# Patient Record
Sex: Female | Born: 1995 | Race: Black or African American | Hispanic: No | Smoking: Never smoker
Health system: Southern US, Community
[De-identification: ages and names within clinical notes are randomized; demographics above are authoritative.]

## PROBLEM LIST (undated history)

## (undated) DIAGNOSIS — O24419 Gestational diabetes mellitus in pregnancy, unspecified control: Secondary | ICD-10-CM

## (undated) DIAGNOSIS — J45909 Unspecified asthma, uncomplicated: Secondary | ICD-10-CM

## (undated) DIAGNOSIS — G43909 Migraine, unspecified, not intractable, without status migrainosus: Secondary | ICD-10-CM

## (undated) HISTORY — PX: TONSILLECTOMY AND ADENOIDECTOMY: SUR1326

---

## 2016-06-08 ENCOUNTER — Other Ambulatory Visit: Payer: Self-pay

## 2016-06-13 ENCOUNTER — Encounter (HOSPITAL_COMMUNITY): Payer: Self-pay

## 2016-06-21 ENCOUNTER — Other Ambulatory Visit (HOSPITAL_COMMUNITY): Payer: Self-pay | Admitting: Nurse Practitioner

## 2016-06-21 DIAGNOSIS — O280 Abnormal hematological finding on antenatal screening of mother: Secondary | ICD-10-CM

## 2016-06-21 DIAGNOSIS — Z3A22 22 weeks gestation of pregnancy: Secondary | ICD-10-CM

## 2016-06-21 DIAGNOSIS — Z3689 Encounter for other specified antenatal screening: Secondary | ICD-10-CM

## 2016-07-01 ENCOUNTER — Encounter (HOSPITAL_COMMUNITY): Payer: Self-pay | Admitting: *Deleted

## 2016-07-05 ENCOUNTER — Other Ambulatory Visit (HOSPITAL_COMMUNITY): Payer: Self-pay | Admitting: Nurse Practitioner

## 2016-07-05 ENCOUNTER — Ambulatory Visit (HOSPITAL_COMMUNITY)
Admission: RE | Admit: 2016-07-05 | Discharge: 2016-07-05 | Disposition: A | Discharge status: Home / Self Care | Payer: Medicaid - Out of State | Source: Ambulatory Visit | Attending: Nurse Practitioner | Admitting: Nurse Practitioner

## 2016-07-05 ENCOUNTER — Other Ambulatory Visit (HOSPITAL_COMMUNITY): Payer: Self-pay | Admitting: *Deleted

## 2016-07-05 ENCOUNTER — Encounter (HOSPITAL_COMMUNITY): Payer: Self-pay

## 2016-07-05 DIAGNOSIS — Z3689 Encounter for other specified antenatal screening: Secondary | ICD-10-CM

## 2016-07-05 DIAGNOSIS — IMO0002 Reserved for concepts with insufficient information to code with codable children: Secondary | ICD-10-CM

## 2016-07-05 DIAGNOSIS — O280 Abnormal hematological finding on antenatal screening of mother: Secondary | ICD-10-CM | POA: Insufficient documentation

## 2016-07-05 DIAGNOSIS — E669 Obesity, unspecified: Secondary | ICD-10-CM | POA: Insufficient documentation

## 2016-07-05 DIAGNOSIS — Z0489 Encounter for examination and observation for other specified reasons: Secondary | ICD-10-CM

## 2016-07-05 DIAGNOSIS — Z3A22 22 weeks gestation of pregnancy: Secondary | ICD-10-CM

## 2016-07-05 DIAGNOSIS — O99212 Obesity complicating pregnancy, second trimester: Secondary | ICD-10-CM | POA: Insufficient documentation

## 2016-07-05 DIAGNOSIS — O289 Unspecified abnormal findings on antenatal screening of mother: Secondary | ICD-10-CM | POA: Insufficient documentation

## 2016-07-05 HISTORY — DX: Unspecified asthma, uncomplicated: J45.909

## 2016-07-05 HISTORY — DX: Migraine, unspecified, not intractable, without status migrainosus: G43.909

## 2016-07-05 NOTE — Progress Notes (Signed)
Genetic Counseling  High-Risk Gestation Note  Appointment Date:  07/05/2016 Referred By: Orbie Hurst, NP Date of Birth:  [birth year]  Pregnancy History: G1P0 Estimated Date of Delivery: 11/05/16 Estimated Gestational Age: [redacted]w[redacted]d Attending: Eulis Foster, MD   Ms. Sandra Dodson was seen for genetic counseling because of an increased risk for fetal Down syndrome based on maternal serum Quad screening results. Sandra Dodson mother and godmother also attended the genetic counseling appointment today.  In summary:  Reviewed results of screening test  Increased risk for Down syndrome  Discussed additional screening options  NIPS-performed today  Ultrasound-performed today; no anomalies or markers seen; views were suboptimal  Discussed diagnostic testing options  Amniocentesis-declined  Reviewed family history concerns  Discussed general population carrier screening options  CF, SMA, and hemoglobinopathies-declined  She was counseled regarding the screening result and the associated 1 in 189 risk for fetal Down syndrome.  We reviewed chromosomes, nondisjunction, and the common features and variable prognosis of Down syndrome. In addition, we reviewed the screen adjusted reduction in risks for trisomy 18 and open neural tube defects.  We also discussed other explanations for a screen positive result including: a gestational dating error, differences in maternal metabolism, and normal variation. In addition, we discussed that the MShCG/MSDIA MoM values were atypically elevated. Sandra Dodson was counseled that elevations in MShCG/MSDIA are associated with an increased risk for adverse outcomes including: PIH, preeclampsia, poor fetal growth, and preterm delivery. We discussed the recommendation for increased surveillance.   We reviewed other available screening options including noninvasive prenatal screening (NIPS)/cell free DNA (cfDNA) screening, and detailed ultrasound.  She was counseled  that screening tests are used to modify a patient's a priori risk for aneuploidy, typically based on age. This estimate provides a pregnancy specific risk assessment. We reviewed the benefits and limitations of each option. Specifically, we discussed the conditions for which each test screens, the detection rates, and false positive rates of each. She was also counseled regarding diagnostic testing via amniocentesis. We reviewed the approximate 1 in 500 risk for complications from amniocentesis, including spontaneous pregnancy loss. We discussed the possible results that the tests might provide including: positive, negative, unanticipated, and no result. Finally, she was counseled regarding the cost of each option and potential out of pocket expenses. After consideration of all the options, she elected to proceed with NIPS. Those results will be available in 5-7 days. The patient also expressed interest in having a detailed ultrasound.  A complete ultrasound was performed today. The ultrasound report will be documented separately. There were no visualized fetal anomalies or markers suggestive of aneuploidy; however, the views were suboptimal today.  Diagnostic testing was declined today.  She understands that screening tests cannot rule out all birth defects or genetic syndromes. The patient was advised of this limitation and states she still does not want additional testing at this time.   Sandra Dodson was provided with written information regarding cystic fibrosis (CF), spinal muscular atrophy (SMA) and hemoglobinopathies including the carrier frequency, availability of carrier screening and prenatal diagnosis if indicated.  In addition, we discussed that CF and hemoglobinopathies are routinely screened for as part of the [place] newborn screening panel.  After further discussion, she declined screening for CF, SMA and hemoglobinopathies.   Both family histories were reviewed and found to be noncontributory for birth  defects, intellectual disability, and known genetic conditions. Without further information regarding the provided family history, an accurate genetic risk cannot be calculated. Further genetic counseling is warranted  if more information is obtained.  Sandra Dodson denied exposure to environmental toxins or chemical agents. She denied the use of alcohol, tobacco or street drugs. She denied significant viral illnesses during the course of her pregnancy. Her medical and surgical histories were noncontributory.   I counseled Sandra Dodson for approximately 34 minutes regarding the above risks and available options.   Donald Prose, MS  Certified Genetic Counselor

## 2016-07-12 ENCOUNTER — Telehealth (HOSPITAL_COMMUNITY): Payer: Self-pay

## 2016-07-12 ENCOUNTER — Other Ambulatory Visit: Payer: Self-pay

## 2016-07-12 NOTE — Telephone Encounter (Signed)
Called Sandra Dodson to discuss her prenatal cell free DNA test results. Sandra Dodson had [facility] testing through [facility]. The patient was identified by name and DOB.  We reviewed that these are within normal limits, showing a less than 1 in 10,000 risk for trisomies 21, 18 and 13, and monosomy X (Turner syndrome).  In addition, the risk for triploidy and sex chromosome trisomies (47,XXX and 47,XXY) was also low risk.  We reviewed that this testing identifies > 99% of pregnancies with trisomy 93, trisomy 58, sex chromosome trisomies (47,XXX and 47,XXY), and triploidy. The detection rate for trisomy 18 is 96%.  The detection rate for monosomy X is ~92%.  The false positive rate is <0.1% for all conditions. Testing was also consistent with female fetal sex. The patient did wish to know fetal sex.  She understands that this testing does not identify all genetic conditions.  All questions were answered to her satisfaction, she was encouraged to call with additional questions or concerns.  Donald Prose, MS Certified Genetic Counselor

## 2016-08-02 ENCOUNTER — Ambulatory Visit (HOSPITAL_COMMUNITY)
Admission: RE | Admit: 2016-08-02 | Discharge: 2016-08-02 | Disposition: A | Discharge status: Home / Self Care | Payer: Medicaid - Out of State | Source: Ambulatory Visit | Attending: Nurse Practitioner | Admitting: Nurse Practitioner

## 2016-08-12 ENCOUNTER — Ambulatory Visit (HOSPITAL_COMMUNITY)
Admission: RE | Admit: 2016-08-12 | Discharge: 2016-08-12 | Disposition: A | Discharge status: Home / Self Care | Payer: Medicaid - Out of State | Source: Ambulatory Visit | Attending: Nurse Practitioner | Admitting: Nurse Practitioner

## 2016-08-12 DIAGNOSIS — Z0489 Encounter for examination and observation for other specified reasons: Secondary | ICD-10-CM

## 2016-08-12 DIAGNOSIS — IMO0002 Reserved for concepts with insufficient information to code with codable children: Secondary | ICD-10-CM

## 2016-08-30 ENCOUNTER — Ambulatory Visit (HOSPITAL_COMMUNITY): Payer: Medicaid - Out of State

## 2016-09-01 ENCOUNTER — Other Ambulatory Visit (HOSPITAL_COMMUNITY): Payer: Self-pay | Admitting: *Deleted

## 2016-09-01 ENCOUNTER — Ambulatory Visit (HOSPITAL_COMMUNITY)
Admission: RE | Admit: 2016-09-01 | Discharge: 2016-09-01 | Disposition: A | Discharge status: Home / Self Care | Payer: Medicaid - Out of State | Source: Ambulatory Visit | Attending: Nurse Practitioner | Admitting: Nurse Practitioner

## 2016-09-01 ENCOUNTER — Encounter (HOSPITAL_COMMUNITY): Payer: Self-pay

## 2016-09-01 DIAGNOSIS — O09899 Supervision of other high risk pregnancies, unspecified trimester: Secondary | ICD-10-CM

## 2016-09-01 DIAGNOSIS — Z36 Encounter for antenatal screening for chromosomal anomalies: Secondary | ICD-10-CM | POA: Insufficient documentation

## 2016-09-01 DIAGNOSIS — O288 Other abnormal findings on antenatal screening of mother: Principal | ICD-10-CM

## 2016-09-01 DIAGNOSIS — Z3A30 30 weeks gestation of pregnancy: Secondary | ICD-10-CM | POA: Insufficient documentation

## 2016-09-01 DIAGNOSIS — O99213 Obesity complicating pregnancy, third trimester: Secondary | ICD-10-CM | POA: Insufficient documentation

## 2016-09-01 DIAGNOSIS — O289 Unspecified abnormal findings on antenatal screening of mother: Secondary | ICD-10-CM | POA: Insufficient documentation

## 2016-09-29 ENCOUNTER — Ambulatory Visit (HOSPITAL_COMMUNITY)
Admission: RE | Admit: 2016-09-29 | Discharge: 2016-09-29 | Disposition: A | Discharge status: Home / Self Care | Payer: Medicaid - Out of State | Source: Ambulatory Visit | Attending: Nurse Practitioner | Admitting: Nurse Practitioner

## 2016-09-29 ENCOUNTER — Encounter (HOSPITAL_COMMUNITY): Payer: Self-pay

## 2016-09-29 ENCOUNTER — Other Ambulatory Visit (HOSPITAL_COMMUNITY): Payer: Self-pay | Admitting: *Deleted

## 2016-09-29 DIAGNOSIS — O24414 Gestational diabetes mellitus in pregnancy, insulin controlled: Secondary | ICD-10-CM | POA: Insufficient documentation

## 2016-09-29 DIAGNOSIS — Z3A34 34 weeks gestation of pregnancy: Secondary | ICD-10-CM | POA: Insufficient documentation

## 2016-09-29 DIAGNOSIS — O288 Other abnormal findings on antenatal screening of mother: Secondary | ICD-10-CM | POA: Diagnosis present

## 2016-09-29 DIAGNOSIS — O09899 Supervision of other high risk pregnancies, unspecified trimester: Secondary | ICD-10-CM | POA: Diagnosis present

## 2016-09-29 DIAGNOSIS — O99212 Obesity complicating pregnancy, second trimester: Secondary | ICD-10-CM | POA: Insufficient documentation

## 2016-10-21 ENCOUNTER — Other Ambulatory Visit (HOSPITAL_COMMUNITY): Payer: Self-pay | Admitting: Maternal and Fetal Medicine

## 2016-10-21 ENCOUNTER — Encounter (HOSPITAL_COMMUNITY): Payer: Self-pay

## 2016-10-21 ENCOUNTER — Ambulatory Visit (HOSPITAL_COMMUNITY)
Admission: RE | Admit: 2016-10-21 | Discharge: 2016-10-21 | Disposition: A | Discharge status: Home / Self Care | Payer: Medicaid - Out of State | Source: Ambulatory Visit | Attending: Nurse Practitioner | Admitting: Nurse Practitioner

## 2016-10-21 DIAGNOSIS — O24414 Gestational diabetes mellitus in pregnancy, insulin controlled: Secondary | ICD-10-CM

## 2016-10-21 DIAGNOSIS — Z362 Encounter for other antenatal screening follow-up: Secondary | ICD-10-CM

## 2016-10-21 DIAGNOSIS — O99213 Obesity complicating pregnancy, third trimester: Secondary | ICD-10-CM

## 2016-10-21 DIAGNOSIS — Z3A37 37 weeks gestation of pregnancy: Secondary | ICD-10-CM

## 2016-10-21 DIAGNOSIS — O281 Abnormal biochemical finding on antenatal screening of mother: Secondary | ICD-10-CM

## 2016-10-21 HISTORY — DX: Gestational diabetes mellitus in pregnancy, unspecified control: O24.419

## 2017-01-30 ENCOUNTER — Encounter (HOSPITAL_COMMUNITY): Payer: Self-pay

## 2018-02-17 IMAGING — US US MFM OB TRANSVAGINAL
2 series · 13 of 28 positions shown · non-contrast
Comparison: none

Centre
[address]

1  HARADOES YOOLOKENI            [id number]      [id number]     [id number]
2  HARADOES YOOLOKENI            [id number]      [id number]     [id number]
Indications
22 weeks gestation of pregnancy
Abnormal biochemical screen (quad) for
Trisomy 21 ([DATE]) (elevated inhibin
1.114o4)
Encounter for antenatal screening for
chromosomal anomalies
Obesity complicating pregnancy, second
trimester
OB History
Gravidity:    1         Term:   0        Prem:   0        SAB:   0
TOP:          0       Ectopic:  0        Living: 0
Fetal Evaluation
Num Of Fetuses:     1
Fetal Heart         142
Rate(bpm):
Cardiac Activity:   Observed
Presentation:       Cephalic
Placenta:           Anterior, above cervical os
P. Cord Insertion:  Visualized, central
Amniotic Fluid
AFI FV:      Subjectively within normal limits
Largest Pocket(cm)
3.8
Biometry
BPD:      54.2  mm     G. Age:  22w 3d         49  %    CI:        77.92   %   70 - 86
FL/HC:      18.0   %   18.4 -
HC:      194.3  mm     G. Age:  21w 4d         12  %    HC/AC:      1.12       1.06 -
AC:       174   mm     G. Age:  22w 2d         39  %    FL/BPD:     64.4   %   71 - 87
FL:       34.9  mm     G. Age:  21w 0d          7  %    FL/AC:      20.1   %   20 - 24
HUM:      32.3  mm     G. Age:  20w 6d          9  %
Est. FW:     449  gm          1 lb      31  %
Gestational Age
LMP:           23w 1d       Date:   01/25/16                 EDD:   10/31/16
U/S Today:     21w 6d                                        EDD:   11/09/16
Best:          22w 3d    Det. By:   Previous Ultrasound      EDD:   11/05/16
(06/13/16)
Anatomy
Cranium:               Appears normal         Aortic Arch:            Not well visualized
Cavum:                 Appears normal         Ductal Arch:            Not well visualized
Ventricles:            Appears normal         Diaphragm:              Not well visualized
Choroid Plexus:        Appears normal         Stomach:                Appears normal, left
sided
Cerebellum:            Appears normal         Abdomen:                Appears normal
Posterior Fossa:       Not well visualized    Abdominal Wall:         Appears nml (cord
insert, abd wall)
Nuchal Fold:           Not applicable (>20    Cord Vessels:           Appears normal (3
wks GA)                                        vessel cord)
Face:                  Orbits nl; profile not Kidneys:                Appear normal
well visualized
Lips:                  Not well visualized    Bladder:                Appears normal
Thoracic:              Appears normal         Spine:                  Not well visualized
Heart:                 Not well visualized    Upper Extremities:      Appears normal;
hands nws
RVOT:                  Not well visualized    Lower Extremities:      Appears normal;
ankles nws
LVOT:                  Not well visualized
Other:  Heels visualized. Technically difficult due to maternal habitus and
fetal position. Rt 5th digit visualized.
Cervix Uterus Adnexa
Cervix
Length:            3.3  cm.
Normal appearance by transvaginal scan
Uterus
No abnormality visualized.
Left Ovary
No adnexal mass visualized.
Right Ovary
Cul De Sac:   No free fluid seen.
Adnexa:       No abnormality visualized.
Impression
INDICATION: 21 yr old G1P0 at 88w8d with elevated risk of
trisomy 2[REDACTED] on quad screen and elevated inhibin of
1.114o4 for fetal anatomic survey.

[Series 1: us mfm ob transvaginal · 52 acquisitions, 10 frames shown (1 of 2)]
[im 3/52]
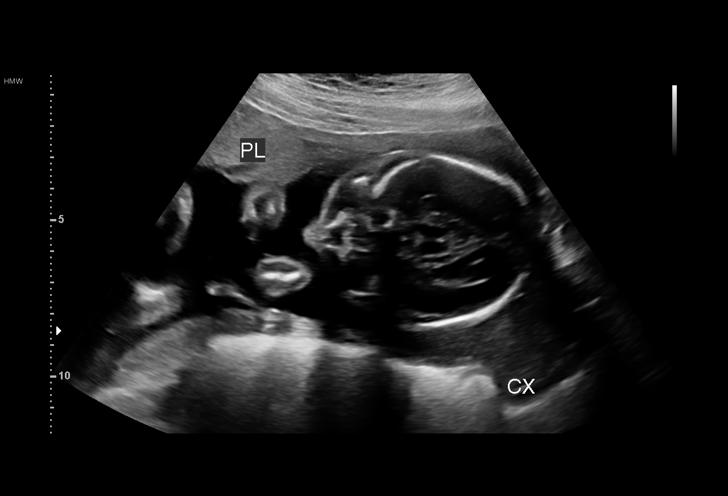
[im 8/52]
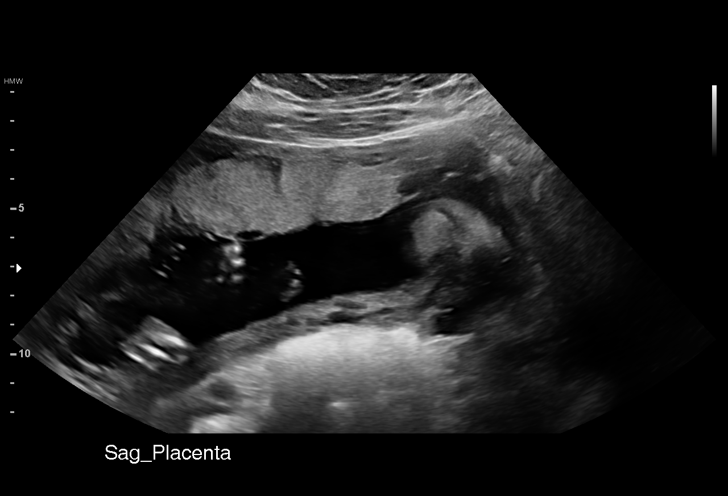
[im 13/52]
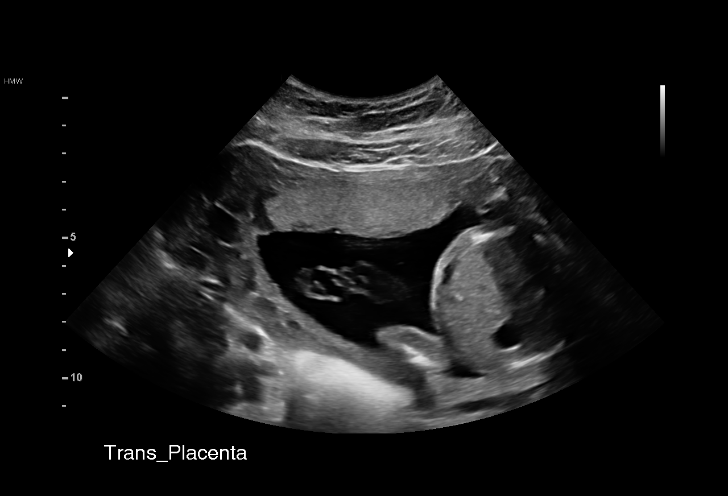
[im 18/52]
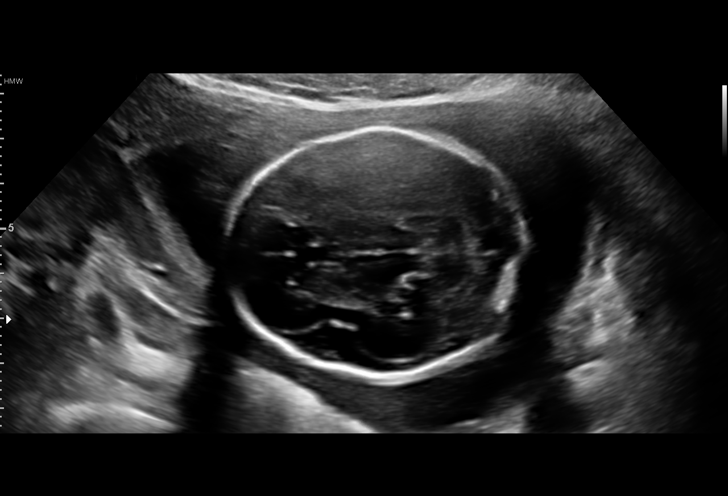
[im 22/52]
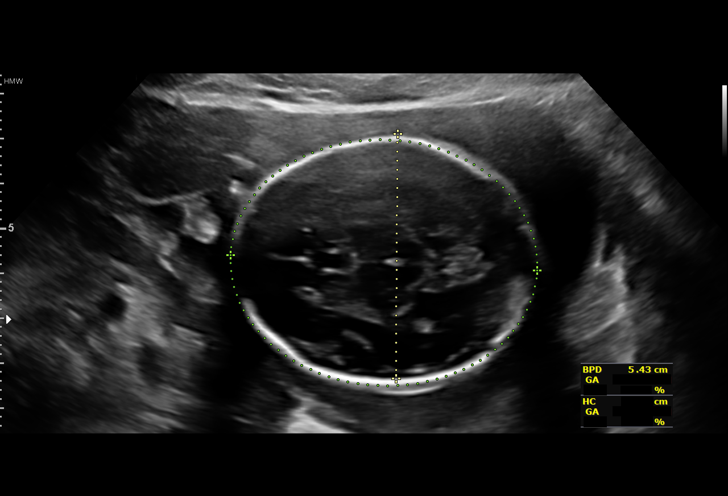
[im 27/52]
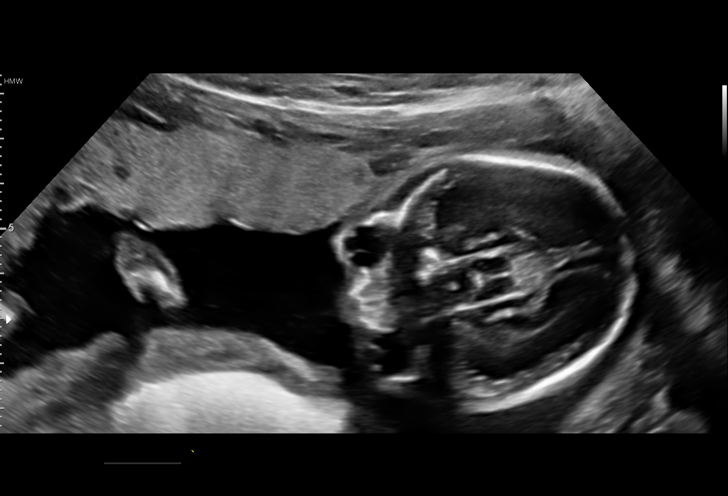
[im 35/52]
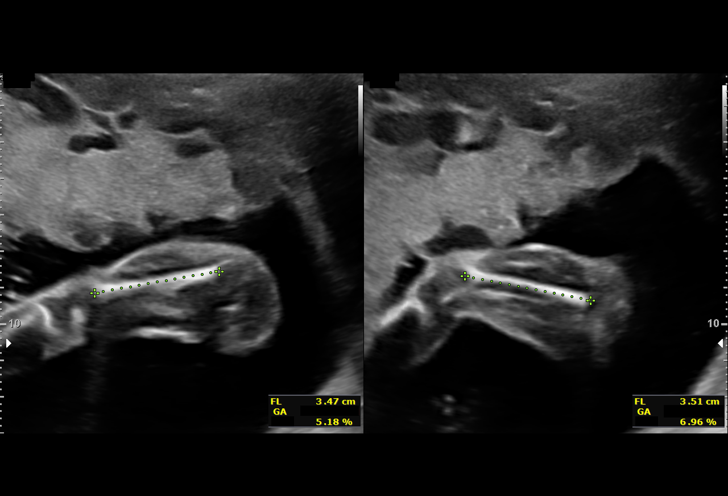
[im 39/52]
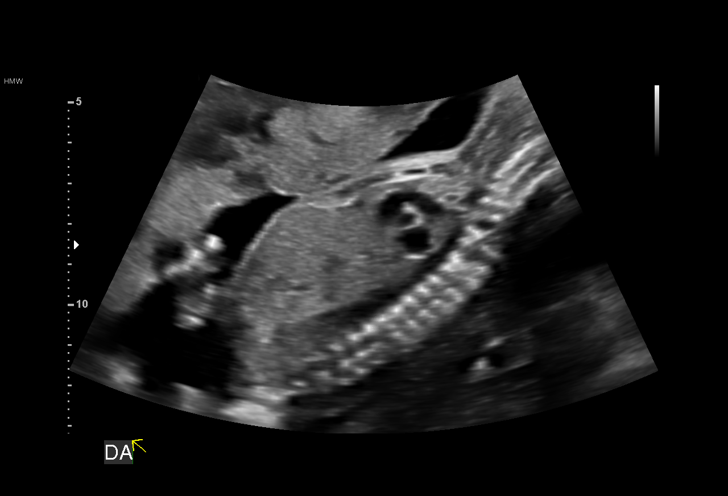
[im 44/52]
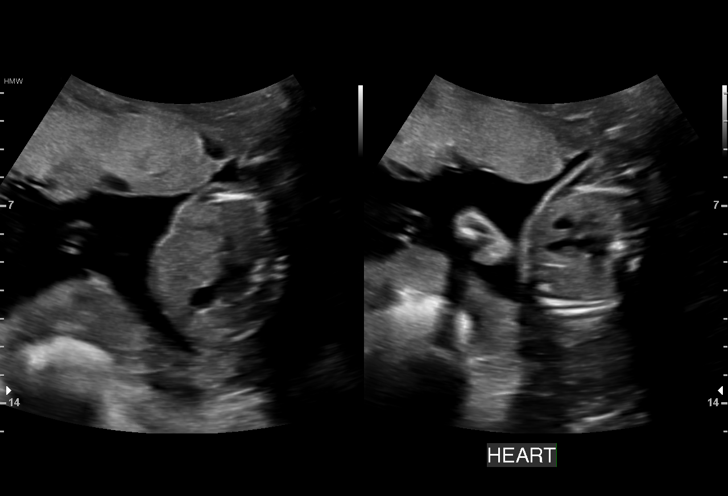
[im 49/52]
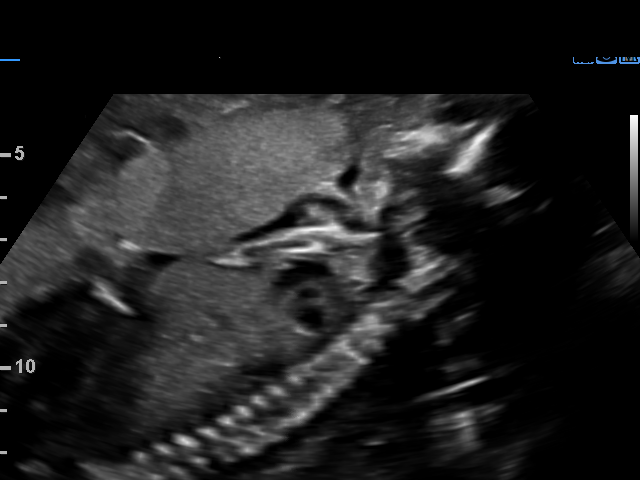

[Series 3: us mfm ob transvaginal · 3 of 14 slices shown (2 of 2)]
[im 1/14]
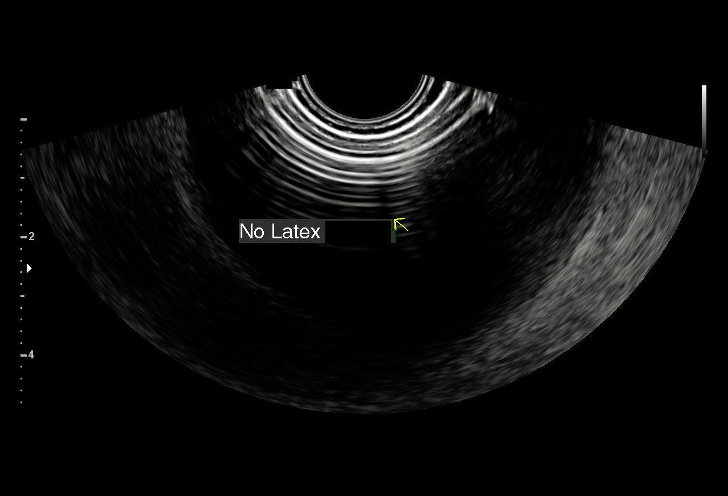
[im 6/14]
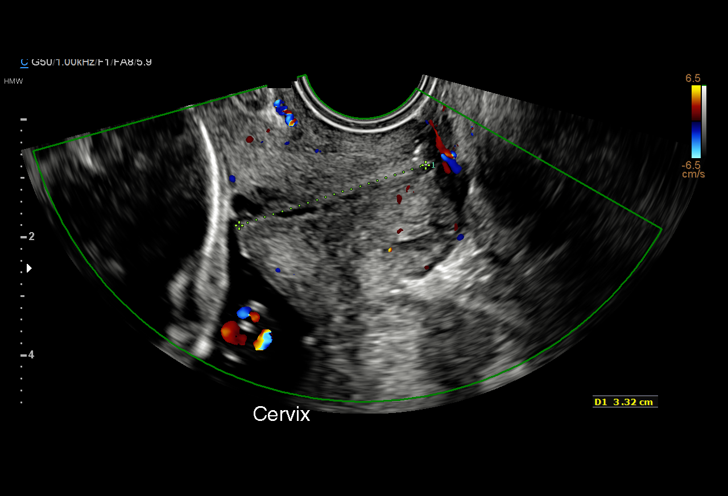
[im 11/14]
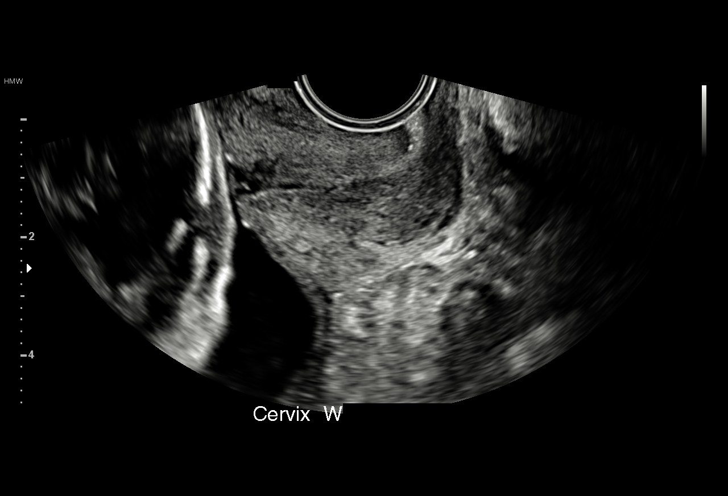

[13 of 28 positions shown; findings below may reference images not displayed]

FINDINGS: 1. Single intrauterine pregnancy.
2. Estimated fetal weight is in the 31st%.
3. Anterior placenta without evidence of previa.
4. Normal amniotic fluid volume.
5. Normal transvaginal cervical length.
6. The anatomy survey is limited as above; no abnormalities
seen on limited exam.
Recommendations

1. Appropriate fetal growth.
2. Limited anatomy survey:
- recommend follow up in 4 weeks to reevaluate fetal anatomy
3. Abnormal quad screen:
- patient met with the genetic counselor; see separate report
- after counseling patient opted for cell free fetal DNA which
was drawn today
4. Elevated inhibin:
- discussed association with increased risk of fetal growth
restriction, preeclampsia, preterm delivery, abruption, and
stillbirth
- recommend serial fetal growth
- recommend close surveillance for the development of
signs/symptoms of preeclampsia

## 2018-05-14 IMAGING — US US MFM OB FOLLOW-UP
1 series · 14 of 28 positions shown · non-contrast
Comparison: none

[Series 1: us mfm ob follow-up · 62 acquisitions, 14 frames shown]
[im 3/62]
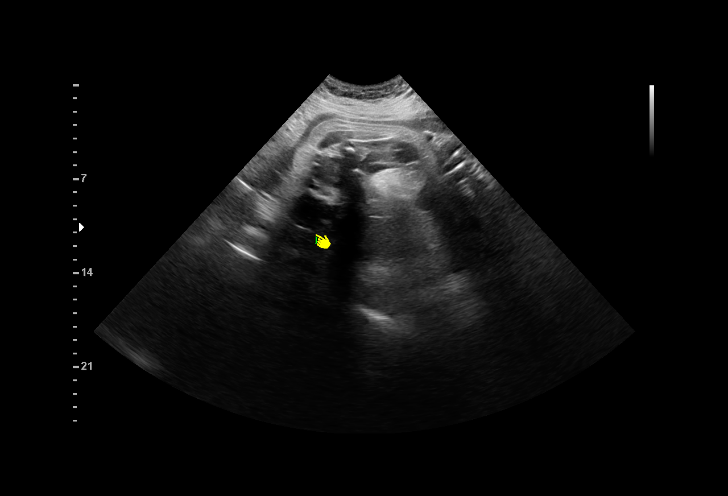
[im 7/62]
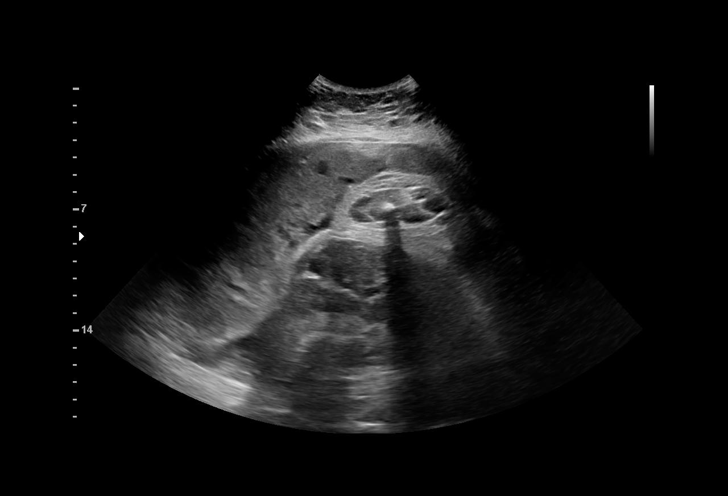
[im 12/62]
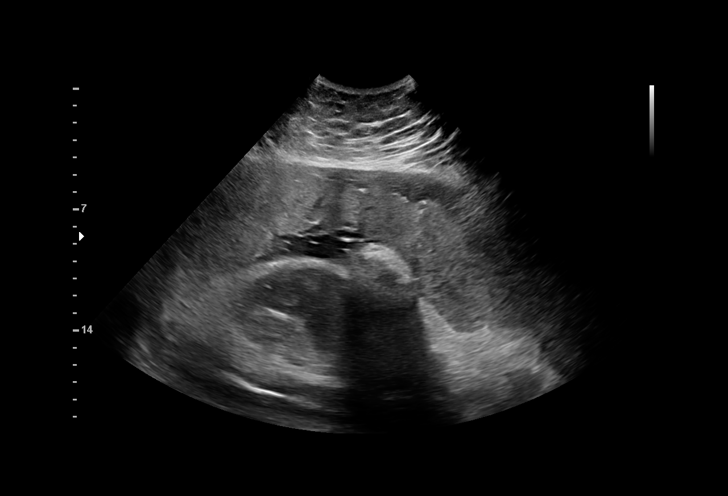
[im 16/62]
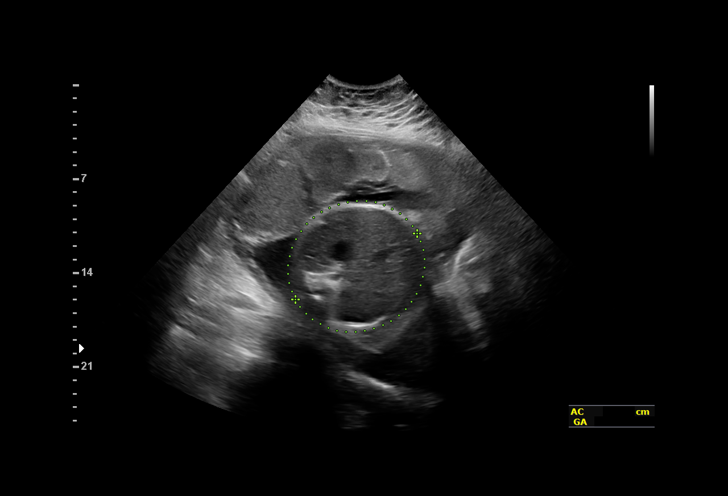
[im 21/62]
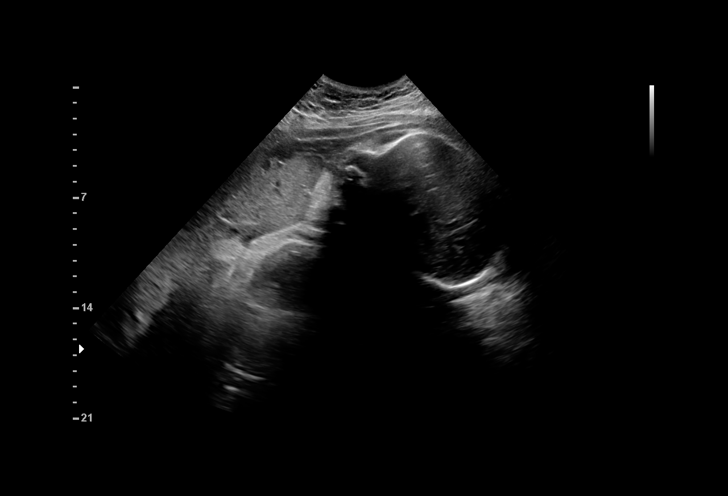
[im 25/62]
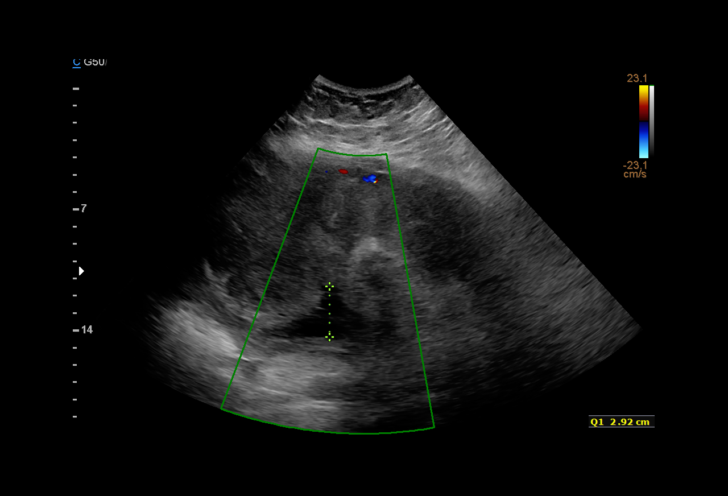
[im 30/62]
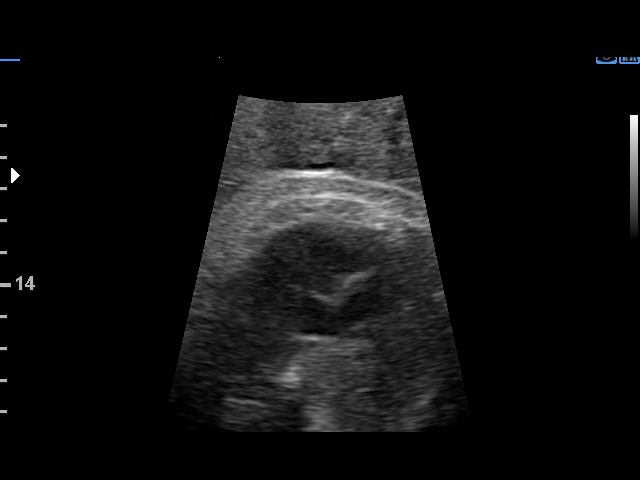
[im 34/62]
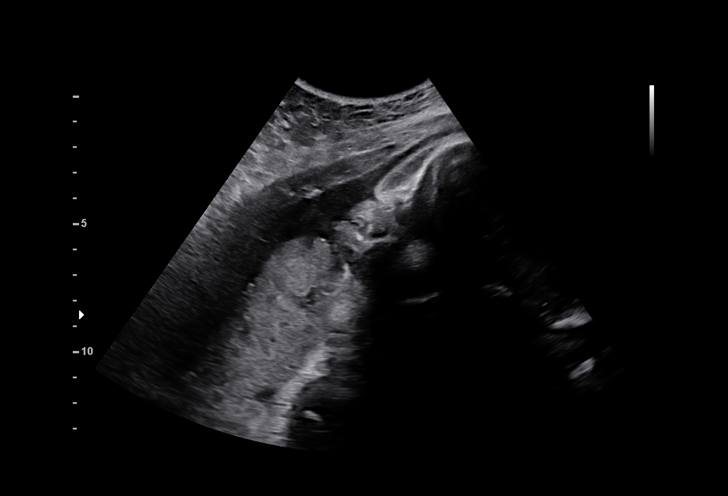
[im 39/62]
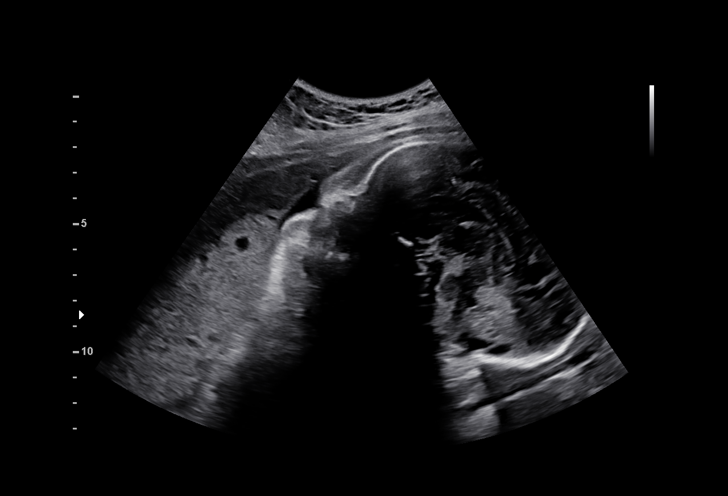
[im 43/62]
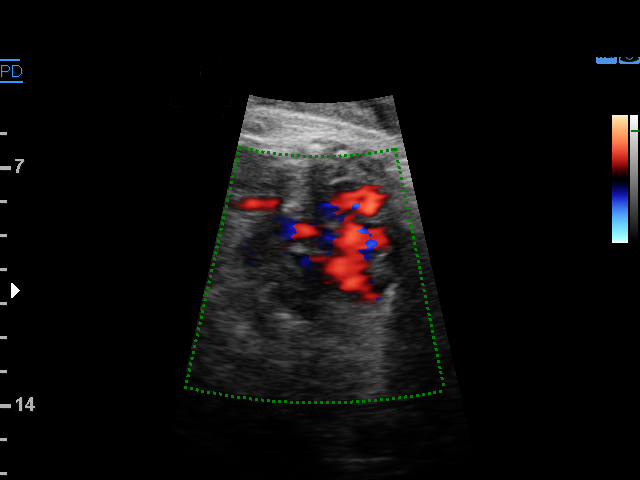
[im 48/62]
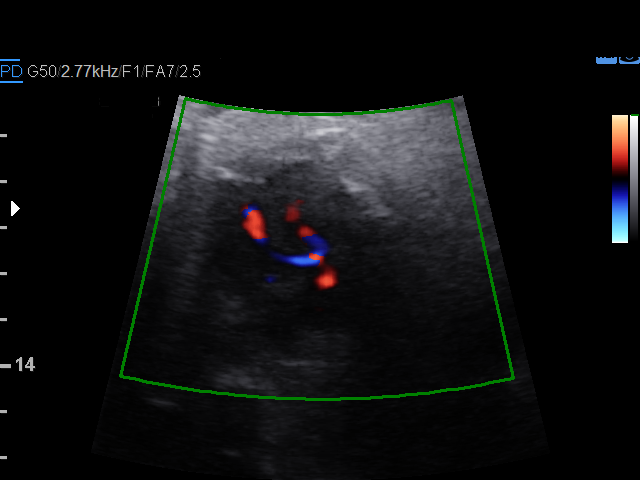
[im 52/62]
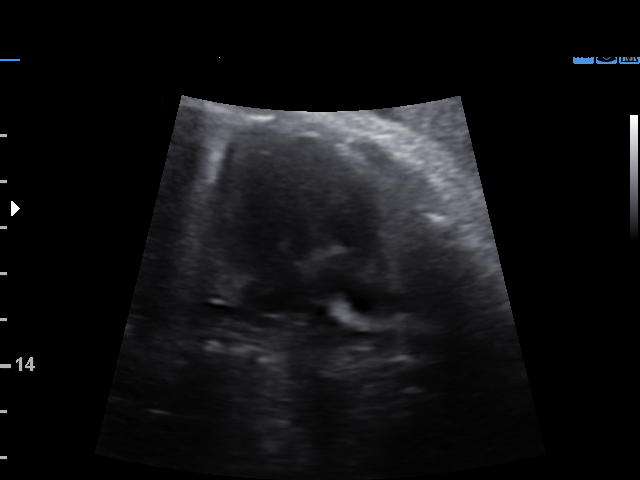
[im 57/62]
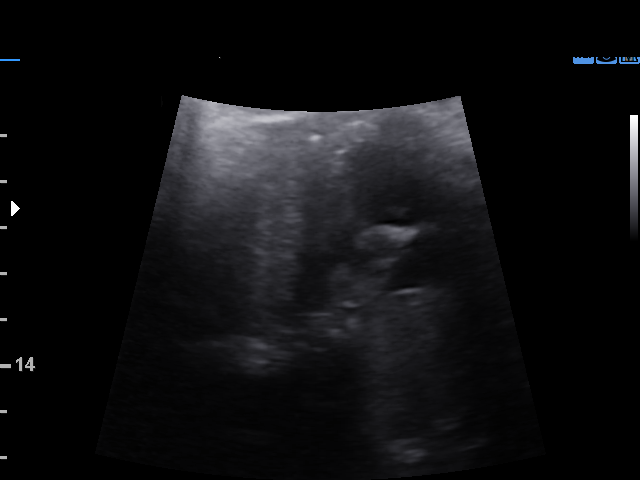
[im 62/62]
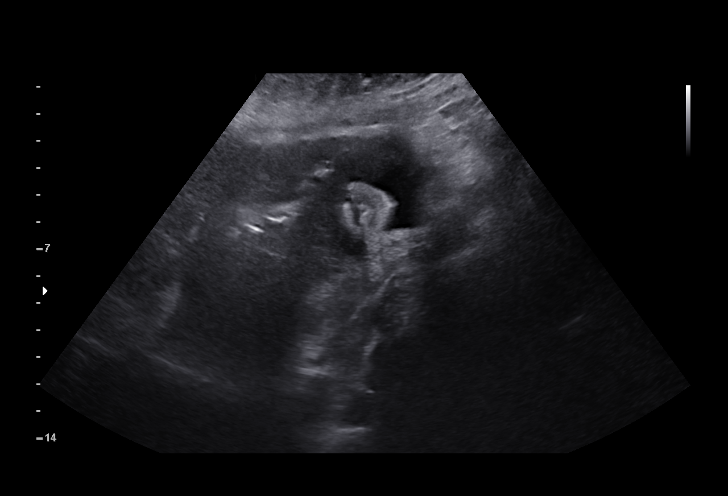

[14 of 28 positions shown; findings below may reference images not displayed]

Centre
[address]

1  DONGHUA MICHAELOV            [id number]      [id number]     [id number]
Indications

34 weeks gestation of pregnancy
Abnormal biochemical screen (quad) for
Trisomy 21 ([DATE]) (elevated inhibin
[id number]); low risk NIPS
Obesity complicating pregnancy, third
trimester
Gestational diabetes in pregnancy, insulin
controlled
OB History

Blood Type:            Height:         Weight (lb):  258      BMI:
Gravidity:    1         Term:   0        Prem:   0        SAB:   0
TOP:          0       Ectopic:  0        Living: 0
Fetal Evaluation

Num Of Fetuses:     1
Fetal Heart         141
Rate(bpm):
Cardiac Activity:   Observed
Presentation:       Cephalic
Placenta:           Anterior, above cervical os
P. Cord Insertion:  Previously Visualized

Amniotic Fluid
AFI FV:      Subjectively within normal limits

AFI Sum(cm)     %Tile       Largest Pocket(cm)
15.3            55

RUQ(cm)       RLQ(cm)       LUQ(cm)        LLQ(cm)
2.92
Biometry

BPD:      89.2  mm     G. Age:  36w 1d         86  %    CI:        79.91   %   70 - 86
FL/HC:      20.3   %   20.1 -
HC:      315.3  mm     G. Age:  35w 3d         32  %    HC/AC:      0.98       0.93 -
AC:      321.8  mm     G. Age:  36w 1d         88  %    FL/BPD:     71.6   %   71 - 87
FL:       63.9  mm     G. Age:  33w 0d          8  %    FL/AC:      19.9   %   20 - 24
HUM:      58.8  mm     G. Age:  34w 0d         51  %

Est. FW:    7673  gm    5 lb 13 oz      71  %
Gestational Age

LMP:           35w 3d       Date:   01/25/16                 EDD:   10/31/16
U/S Today:     35w 1d                                        EDD:   11/02/16
Best:          34w 5d    Det. By:   Previous Ultrasound      EDD:   11/05/16
(06/13/16)
Anatomy

Cranium:               Appears normal         Aortic Arch:            Previously seen
Cavum:                 Previously seen        Ductal Arch:            Previously seen
Ventricles:            Previously seen        Diaphragm:              Previously seen
Choroid Plexus:        Previously seen        Stomach:                Appears normal, left
sided
Cerebellum:            Previously seen        Abdomen:                Appears normal
Posterior Fossa:       Not well visualized    Abdominal Wall:         Previously seen
Nuchal Fold:           Not applicable (>20    Cord Vessels:           Previously seen
wks GA)
Face:                  Orbits prev vis;       Kidneys:                Previously seen
profile appears
normal
Lips:                  Appears normal         Bladder:                Appears normal
Thoracic:              Appears normal         Spine:                  Limited views prev
vis appear nl
Heart:                 Appears normal         Upper Extremities:      Previously seen;
(4CH, axis, and                                hands nws
situs)
RVOT:                  Appears normal         Lower Extremities:      Previously seen;
ankles nws
LVOT:                  Appears normal

Other:  Heels previously visualized. Technically difficult due to maternal
habitus and fetal position. Rt 5th digit  previously visualized.
Cervix Uterus Adnexa

Cervix
Not visualized (advanced GA >00wks)
Impression

SIUP at 34+5 weeks
Normal interval anatomy; anatomic survey complete except
for PF; heart was again very difficult to image but it appeared
grossly normal; significant amount of adipose tissue
visualized (especially face and abdomen)
Normal amniotic fluid volume
Appropriate interval growth with EFW at the 71st %tile; AC at
the 88th %tile
Recommendations

Begin twice weekly NSTs with weekly AFIs
Growth US in 3 weeks (here or with you) for EFW
